# Patient Record
Sex: Female | Born: 1965 | Race: Black or African American | Hispanic: No | Marital: Single | Smoking: Current every day smoker
Health system: Southern US, Community
[De-identification: ages and names within clinical notes are randomized; demographics above are authoritative.]

## PROBLEM LIST (undated history)

## (undated) HISTORY — PX: APPENDECTOMY: SHX54

---

## 2003-07-19 ENCOUNTER — Ambulatory Visit (HOSPITAL_COMMUNITY): Admission: RE | Admit: 2003-07-19 | Discharge: 2003-07-19 | Payer: Self-pay | Admitting: Family Medicine

## 2007-01-12 ENCOUNTER — Emergency Department (HOSPITAL_COMMUNITY): Admission: EM | Admit: 2007-01-12 | Discharge: 2007-01-12 | Payer: Self-pay | Admitting: Family Medicine

## 2011-12-22 ENCOUNTER — Encounter (HOSPITAL_COMMUNITY): Payer: Self-pay | Admitting: *Deleted

## 2011-12-22 ENCOUNTER — Emergency Department (HOSPITAL_COMMUNITY)
Admission: EM | Admit: 2011-12-22 | Discharge: 2011-12-23 | Disposition: A | Discharge status: Home / Self Care | Payer: Self-pay | Attending: Emergency Medicine | Admitting: Emergency Medicine

## 2011-12-22 ENCOUNTER — Emergency Department (HOSPITAL_COMMUNITY): Payer: Self-pay

## 2011-12-22 DIAGNOSIS — M25559 Pain in unspecified hip: Secondary | ICD-10-CM | POA: Insufficient documentation

## 2011-12-22 DIAGNOSIS — W19XXXA Unspecified fall, initial encounter: Secondary | ICD-10-CM

## 2011-12-22 DIAGNOSIS — M25551 Pain in right hip: Secondary | ICD-10-CM

## 2011-12-22 DIAGNOSIS — F10929 Alcohol use, unspecified with intoxication, unspecified: Secondary | ICD-10-CM

## 2011-12-22 DIAGNOSIS — F1010 Alcohol abuse, uncomplicated: Secondary | ICD-10-CM | POA: Insufficient documentation

## 2011-12-22 DIAGNOSIS — W108XXA Fall (on) (from) other stairs and steps, initial encounter: Secondary | ICD-10-CM | POA: Insufficient documentation

## 2011-12-22 DIAGNOSIS — Y92009 Unspecified place in unspecified non-institutional (private) residence as the place of occurrence of the external cause: Secondary | ICD-10-CM | POA: Insufficient documentation

## 2011-12-22 NOTE — ED Notes (Addendum)
Pt presents to the ED with c/o of fall with right hip pain. Pt reports falling down the last two steps at her house. Pt states this happens a lot at her house because she continually slips on the carpeted steps. Pt states she landed on her right hip. Pt denies LOC, hitter her head, n/v. Pt is sitting at a 45 degree angle moving all around in her bed. Pt is very agitated and keeps c/o of being thirsty. Pt is very hard to console. Pt has no internal rotation with shortening. Pt is able to straighten leg on command with no report of increase pain. Pt reports increase in pain upon palpation

## 2011-12-22 NOTE — ED Notes (Signed)
Patient transported to X-ray 

## 2011-12-23 LAB — ETHANOL: Alcohol, Ethyl (B): 218 mg/dL — ABNORMAL HIGH (ref 0–11)

## 2011-12-23 MED ORDER — HYDROCODONE-ACETAMINOPHEN 5-325 MG PO TABS
1.0000 | ORAL_TABLET | Freq: Once | ORAL | Status: AC
  Administered 2011-12-23: 1 via ORAL
  Filled 2011-12-23: qty 1

## 2011-12-23 MED ORDER — SODIUM CHLORIDE 0.9 % IV BOLUS (SEPSIS)
500.0000 mL | Freq: Once | INTRAVENOUS | Status: AC
  Administered 2011-12-23: 500 mL via INTRAVENOUS

## 2011-12-23 MED ORDER — IBUPROFEN 600 MG PO TABS: 600.0000 mg | ORAL_TABLET | Freq: Three times a day (TID) | ORAL | Status: AC | PRN

## 2011-12-23 NOTE — ED Provider Notes (Signed)
History     CSN: [id number]  Arrival date & time 12/22/11  2313   First MD Initiated Contact with Patient 12/22/11 2330      Chief Complaint  Patient presents with  . Fall  . Hip Pain    right    (Consider location/radiation/quality/duration/timing/severity/associated sxs/prior treatment) The history is provided by the patient.   patient reports she was walking down the steps at her house this evening and she fell landing on her right hip.  She did not hit her head.  She denies neck pain.  She has no weakness in her upper or lower extremities.  She has no headache.  She denies loss of consciousness.  She's had no recent weakness of her arms or legs.  She's had no difficulty with her speech.  She does report drinking alcohol tonight.  Her pain is moderate but at this time she reports she does not want any medicine.  Her pain is worsened by ambulating and by movement of her right hip.  She denies back pain.  She denies weakness or numbness of her right lower extremity she denies numbness  History reviewed. No pertinent past medical history.  Past Surgical History  Procedure Date  . Appendectomy     History reviewed. No pertinent family history.  History  Substance Use Topics  . Smoking status: Current Everyday Smoker  . Smokeless tobacco: Never Used  . Alcohol Use: 48.0 oz/week    80 Cans of beer per week    OB History    Grav Para Term Preterm Abortions TAB SAB Ect Mult Living                  Review of Systems  All other systems reviewed and are negative.    Allergies  Review of patient's allergies indicates no known allergies.  Home Medications   Current Outpatient Rx  Name Route Sig Dispense Refill  . IBUPROFEN 200 MG PO TABS Oral Take 200 mg by mouth every 6 (six) hours as needed.    . IBUPROFEN 600 MG PO TABS Oral Take 1 tablet (600 mg total) by mouth every 8 (eight) hours as needed for pain. 15 tablet 0    BP 109/56  Pulse 92  Temp(Src) 98.7 F (37.1  C) (Oral)  Resp 22  Ht 5\' 7"  (1.702 m)  SpO2 100%  LMP 12/10/2011  Physical Exam  Nursing note and vitals reviewed. Constitutional: She is oriented to person, place, and time. She appears well-developed and well-nourished. No distress.       she smells of alcohol  HENT:  Head: Normocephalic and atraumatic.  Eyes: EOM are normal.  Neck: Normal range of motion.  Cardiovascular: Normal rate, regular rhythm and normal heart sounds.   Pulmonary/Chest: Effort normal and breath sounds normal.  Abdominal: Soft. She exhibits no distension. There is no tenderness.  Musculoskeletal:       Pain with range of motion of right hip.  No obvious deformity.  Normal pulses in her bilateral PT and DP pulses.  No ecchymosis lacerations or abrasions  Neurological: She is alert and oriented to person, place, and time.  Skin: Skin is warm and dry.  Psychiatric: She has a normal mood and affect. Judgment normal.    ED Course  Procedures (including critical care time)  Labs Reviewed  ETHANOL - Abnormal; Notable for the following:    Alcohol, Ethyl (B) 218 (*)    All other components within normal limits   Dg Hip  Complete Right  12/23/2011  *RADIOLOGY REPORT*  Clinical Data: Post fall, now with right-sided hip pain  RIGHT HIP - COMPLETE 2+ VIEW  Comparison: None.  Findings: No fracture dislocation.  Right hip joint spaces are preserved.  Limited visualization of the pelvis is normal. Regional soft tissues are normal.  IMPRESSION: No fracture or significant degenerative change.  Original Report Authenticated By: Waynard Reeds, M.D.     1. Fall   2. Right hip pain   3. Alcohol intoxication       MDM   I suspect this is contusion.  Her fall was mechanical.  Suspect this was exacerbated because of her alcohol use tonight.  She reports she continues to fall out of encouraged her to stop drinking so much alcohol as I think this is affecting her ability to ambulate  1:12 AM Alcohol level CCXVIII.   X-ray negative.  The patient has been ambulating on her leg.  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  DC home in good condition.      Lyanne Co, MD 12/23/11 [phone]

## 2011-12-23 NOTE — Discharge Instructions (Signed)
Alcohol Problems Most adults who drink alcohol drink in moderation (not a lot) are at low risk for developing problems related to their drinking. However, all drinkers, including low-risk drinkers, should know about the health risks connected with drinking alcohol. RECOMMENDATIONS FOR LOW-RISK DRINKING  Drink in moderation. Moderate drinking is defined as follows:   Men - no more than 2 drinks per day.   Nonpregnant women - no more than 1 drink per day.   Over age 45 - no more than 1 drink per day.  A standard drink is 12 grams of pure alcohol, which is equal to a 12 ounce bottle of beer or wine cooler, a 5 ounce glass of wine, or 1.5 ounces of distilled spirits (such as whiskey, brandy, vodka, or rum).  ABSTAIN FROM (DO NOT DRINK) ALCOHOL:  When pregnant or considering pregnancy.   When taking a medication that interacts with alcohol.   If you are alcohol dependent.   A medical condition that prohibits drinking alcohol (such as ulcer, liver disease, or heart disease).  DISCUSS WITH YOUR CAREGIVER:  If you are at risk for coronary heart disease, discuss the potential benefits and risks of alcohol use: Light to moderate drinking is associated with lower rates of coronary heart disease in certain populations (for example, men over age 45 and postmenopausal women). Infrequent or nondrinkers are advised not to begin light to moderate drinking to reduce the risk of coronary heart disease so as to avoid creating an alcohol-related problem. Similar protective effects can likely be gained through proper diet and exercise.   Women and the elderly have smaller amounts of body water than men. As a result women and the elderly achieve a higher blood alcohol concentration after drinking the same amount of alcohol.   Exposing a fetus to alcohol can cause a broad range of birth defects referred to as Fetal Alcohol Syndrome (FAS) or Alcohol-Related Birth Defects (ARBD). Although FAS/ARBD is connected with  excessive alcohol consumption during pregnancy, studies also have reported neurobehavioral problems in infants born to mothers reporting drinking an average of 1 drink per day during pregnancy.   Heavier drinking (the consumption of more than 4 drinks per occasion by men and more than 3 drinks per occasion by women) impairs learning (cognitive) and psychomotor functions and increases the risk of alcohol-related problems, including accidents and injuries.  CAGE QUESTIONS:   Have you ever felt that you should Cut down on your drinking?   Have people Annoyed you by criticizing your drinking?   Have you ever felt bad or Guilty about your drinking?   Have you ever had a drink first thing in the morning to steady your nerves or get rid of a hangover (Eye opener)?  If you answered positively to any of these questions: You may be at risk for alcohol-related problems if alcohol consumption is:   Men: Greater than 14 drinks per week or more than 4 drinks per occasion.   Women: Greater than 7 drinks per week or more than 3 drinks per occasion.  Do you or your family have a medical history of alcohol-related problems, such as:  Blackouts.   Sexual dysfunction.   Depression.   Trauma.   Liver dysfunction.   Sleep disorders.   Hypertension.   Chronic abdominal pain.   Has your drinking ever caused you problems, such as problems with your family, problems with your work (or school) performance, or accidents/injuries?   Do you have a compulsion to drink or a preoccupation  Do you have a compulsion to drink or a preoccupation with drinking?   Do you have poor control or are you unable to stop drinking once you have started?   Do you have to drink to avoid withdrawal symptoms?   Do you have problems with withdrawal such as tremors, nausea, sweats, or mood disturbances?   Does it take more alcohol than in the past to get you high?   Do you feel a strong urge to drink?   Do you change your plans so that you can have a drink?   Do you ever drink in the morning to relieve  the shakes or a hangover?  If you have answered a number of the previous questions positively, it may be time for you to talk to your caregivers, family, and friends and see if they think you have a problem. Alcoholism is a chemical dependency that keeps getting worse and will eventually destroy your health and relationships. Many alcoholics end up dead, impoverished, or in prison. This is often the end result of all chemical dependency.   Do not be discouraged if you are not ready to take action immediately.   Decisions to change behavior often involve up and down desires to change and feeling like you cannot decide.   Try to think more seriously about your drinking behavior.   Think of the reasons to quit.  WHERE TO GO FOR ADDITIONAL INFORMATION    The National Institute on Alcohol Abuse and Alcoholism (NIAAA)www.niaaa.nih.gov   National Council on Alcoholism and Drug Dependence (NCADD)www.ncadd.org   American Society of Addiction Medicine (ASAM)www.asam.org  Document Released: 08/11/2005 Document Revised: 07/31/2011 Document Reviewed: 03/29/2008  [employer] Patient Information 2012 [employer].

## 2013-06-16 ENCOUNTER — Other Ambulatory Visit (HOSPITAL_COMMUNITY): Payer: Self-pay | Admitting: *Deleted

## 2013-06-16 DIAGNOSIS — N63 Unspecified lump in unspecified breast: Secondary | ICD-10-CM

## 2013-06-16 DIAGNOSIS — N644 Mastodynia: Secondary | ICD-10-CM

## 2013-06-21 ENCOUNTER — Inpatient Hospital Stay (HOSPITAL_COMMUNITY): Admission: RE | Admit: 2013-06-21 | Payer: Self-pay | Source: Ambulatory Visit

## 2013-07-05 ENCOUNTER — Other Ambulatory Visit: Payer: Self-pay

## 2015-10-29 ENCOUNTER — Encounter (HOSPITAL_COMMUNITY): Payer: Self-pay | Admitting: Emergency Medicine

## 2015-10-29 ENCOUNTER — Emergency Department (HOSPITAL_COMMUNITY): Payer: Self-pay

## 2015-10-29 ENCOUNTER — Emergency Department (HOSPITAL_COMMUNITY)
Admission: EM | Admit: 2015-10-29 | Discharge: 2015-10-29 | Disposition: A | Discharge status: Home / Self Care | Payer: Self-pay | Attending: Emergency Medicine | Admitting: Emergency Medicine

## 2015-10-29 DIAGNOSIS — Y9354 Activity, bowling: Secondary | ICD-10-CM | POA: Insufficient documentation

## 2015-10-29 DIAGNOSIS — W1839XA Other fall on same level, initial encounter: Secondary | ICD-10-CM | POA: Insufficient documentation

## 2015-10-29 DIAGNOSIS — F17200 Nicotine dependence, unspecified, uncomplicated: Secondary | ICD-10-CM | POA: Insufficient documentation

## 2015-10-29 DIAGNOSIS — Y998 Other external cause status: Secondary | ICD-10-CM | POA: Insufficient documentation

## 2015-10-29 DIAGNOSIS — R200 Anesthesia of skin: Secondary | ICD-10-CM

## 2015-10-29 DIAGNOSIS — M545 Low back pain, unspecified: Secondary | ICD-10-CM

## 2015-10-29 DIAGNOSIS — S3992XA Unspecified injury of lower back, initial encounter: Secondary | ICD-10-CM | POA: Insufficient documentation

## 2015-10-29 DIAGNOSIS — Y9239 Other specified sports and athletic area as the place of occurrence of the external cause: Secondary | ICD-10-CM | POA: Insufficient documentation

## 2015-10-29 DIAGNOSIS — S7002XA Contusion of left hip, initial encounter: Secondary | ICD-10-CM

## 2015-10-29 DIAGNOSIS — R208 Other disturbances of skin sensation: Secondary | ICD-10-CM

## 2015-10-29 DIAGNOSIS — S8392XA Sprain of unspecified site of left knee, initial encounter: Secondary | ICD-10-CM

## 2015-10-29 LAB — URINALYSIS, ROUTINE W REFLEX MICROSCOPIC
Bilirubin Urine: NEGATIVE
Glucose, UA: NEGATIVE mg/dL
Ketones, ur: NEGATIVE mg/dL
Nitrite: NEGATIVE
Protein, ur: NEGATIVE mg/dL
Specific Gravity, Urine: 1.005 (ref 1.005–1.030)
pH: 6 (ref 5.0–8.0)

## 2015-10-29 LAB — URINE MICROSCOPIC-ADD ON

## 2015-10-29 MED ORDER — PREDNISONE 10 MG PO TABS: 20.0000 mg | ORAL_TABLET | Freq: Every day | ORAL | Status: DC

## 2015-10-29 MED ORDER — TRAMADOL HCL 50 MG PO TABS: 50.0000 mg | ORAL_TABLET | Freq: Four times a day (QID) | ORAL | Status: AC | PRN

## 2015-10-29 MED ORDER — KETOROLAC TROMETHAMINE 60 MG/2ML IM SOLN
60.0000 mg | Freq: Once | INTRAMUSCULAR | Status: AC
  Administered 2015-10-29: 60 mg via INTRAMUSCULAR
  Filled 2015-10-29: qty 2

## 2015-10-29 MED ORDER — ONDANSETRON 4 MG PO TBDP
4.0000 mg | ORAL_TABLET | Freq: Once | ORAL | Status: AC
  Administered 2015-10-29: 4 mg via ORAL
  Filled 2015-10-29: qty 1

## 2015-10-29 NOTE — ED Notes (Signed)
PA at the bedside.

## 2015-10-29 NOTE — Discharge Instructions (Signed)
Lumbosacral Radiculopathy °Lumbosacral radiculopathy is a condition that involves the spinal nerves and nerve roots in the low back and bottom of the spine. The condition develops when these nerves and nerve roots move out of place or become inflamed and cause symptoms. °CAUSES °This condition may be caused by: °· Pressure from a disk that bulges out of place (herniated disk). A disk is a plate of cartilage that separates bones in the spine. °· Disk degeneration. °· A narrowing of the bones of the lower back (spinal stenosis). °· A tumor. °· An infection. °· An injury that places sudden pressure on the disks that cushion the bones of your lower spine. °RISK FACTORS °This condition is more likely to develop in: °· Males aged 30-50 years. °· Females aged 50-60 years. °· People who lift improperly. °· People who are overweight or live a sedentary lifestyle. °· People who smoke. °· People who perform repetitive activities that strain the spine. °SYMPTOMS °Symptoms of this condition include: °· Pain that goes down from the back into the legs (sciatica). This is the most common symptom. The pain may be worse with sitting, coughing, or sneezing. °· Pain and numbness in the arms and legs. °· Muscle weakness. °· Tingling. °· Loss of bladder control or bowel control. °DIAGNOSIS °This condition is diagnosed with a physical exam and medical history. If the pain is lasting, you may have tests, such as: °· MRI scan. °· X-ray. °· CT scan. °· Myelogram. °· Nerve conduction study. °TREATMENT °This condition is often treated with: °· Hot packs and ice applied to affected areas. °· Stretches to improve flexibility. °· Exercises to strengthen back muscles. °· Physical therapy. °· Pain medicine. °· A steroid injection in the spine. °In some cases, no treatment is needed. If the condition is long-lasting (chronic), or if symptoms are severe, treatment may involve surgery or lifestyle changes, such as following a weight loss plan. °HOME  CARE INSTRUCTIONS °Medicines °· Take medicines only as directed by your health care provider. °· Do not drive or operate heavy machinery while taking pain medicine. °Injury Care °· Apply a heat pack to the injured area as directed by your health care provider. °· Apply ice to the affected area: °¨ Put ice in a plastic bag. °¨ Place a towel between your skin and the bag. °¨ Leave the ice on for 20-30 minutes, every 2 hours while you are awake or as needed. Or, leave the ice on for as long as directed by your health care provider. °Other Instructions °· If you were shown how to do any exercises or stretches, do them as directed by your health care provider. °· If your health care provider prescribed a diet or exercise program, follow it as directed. °· Keep all follow-up visits as directed by your health care provider. This is important. °SEEK MEDICAL CARE IF: °· Your pain does not improve over time even when taking pain medicines. °SEEK IMMEDIATE MEDICAL CARE IF: °· Your develop severe pain. °· Your pain suddenly gets worse. °· You develop increasing weakness in your legs. °· You lose the ability to control your bladder or bowel. °· You have difficulty walking or balancing. °· You have a fever. °  °This information is not intended to replace advice given to you by your health care provider. Make sure you discuss any questions you have with your health care provider. °  °Document Released: 08/11/2005 Document Revised: 12/26/2014 Document Reviewed: 08/07/2014 °Elsevier Interactive Patient Education ©2016 [employer] ° °

## 2015-10-29 NOTE — ED Provider Notes (Signed)
Patient signed out to me from [place]ost[place], [place].   She had a fall while she was bowling and presented with left hip and knee pain. Tracer unremarkable for any significant change.  However there is concern of urinary incontinence as well as weakness to the left leg it was unclear if this is due to pain or to weakness in the leg. The patient is agreeable to have MRI done and I will be following up on the results as well as urinalysis.  9:40 pm Patient has gone for MRI however results have not yet been read. Patient has not provided any urine. She would like to eat and is unwilling to wait therefore requests to sign out AGAINST MEDICAL ADVICE.  Risks of leaving without image results discussed. Pt understands the risks and understands that she wil lNOT be called with MRI results. Patient says she is hungry and wants to leave.  Strict return precautions discussed.   Marlon Peliffany Jerron Niblack, PA-C 10/29/15 2141   Patient decided she wanted to stay after leaving AMA. MRI results are back:     MR Lumbar Spine Wo Contrast (Final result) Result time: 10/29/15 21:46:01   Final result by Rad Results In Interface (10/29/15 21:46:01)   Narrative:   CLINICAL DATA: Initial evaluation for acute low back pain with left lower extremity numbness. Status post fall.  EXAM: MRI LUMBAR SPINE WITHOUT CONTRAST  TECHNIQUE: Multiplanar, multisequence MR imaging of the lumbar spine was performed. No intravenous contrast was administered.  COMPARISON: None.  FINDINGS: For the purposes of this dictation, the lowest well-formed intervertebral disc spaces presumed to be the L5-S1 level, and there presumed to be 5 lumbar type vertebral bodies.  Mild levoscoliosis of the lumbar spine with apex at L3 present. There is preservation of the normal lumbar lordosis. No listhesis. Vertebral body heights are well maintained. No fracture. Signal intensity within the vertebral body bone marrow is normal.  Minimal marrow edema within the bilateral pedicles at L4 and L5, likely stress reaction. No frank stress fracture identified. No other marrow edema.  Conus medullaris terminates normally at the L2 level. Signal intensity within the visualized cord is normal. Nerve roots of the cauda equina within normal limits.  Paraspinous soft tissues demonstrate no acute abnormality. Visualized visceral structures within normal limits. Small T2 hyperintense cyst noted within the inferior right hepatic lobe. Probable fibroid uterus. No retroperitoneal adenopathy.  L1-2: Negative.  L2-3: Very mild right-sided facet hypertrophy. Otherwise negative.  L3-4: Mild diffuse annular disc bulge without focal disc herniation. Right greater than left facet arthrosis with ligamentum flavum thickening. Very mild canal stenosis. No significant foraminal narrowing.  L4-5: Mild diffuse circumferential disc bulge. There is a shallow superimposed left foraminal disc protrusion, best appreciated on sagittal sequence (series 3, image 10). Moderate bilateral facet arthrosis with prominent joint effusions within the bilateral L4-5 facets. Mild reactive edema within the bilateral L4 pedicles again noted. Small 6 mm synovial cyst at the posterolateral aspect of the left L4-5 facet. Mild canal and left lateral recess stenosis. Moderate left foraminal stenosis related to disc protrusion and facet disease. No frank neural impingement. More mild right foraminal narrowing noted.  L5-S1: Central/left paracentral disc protrusion with slight caudad migration (series 6, image 34). Protruding disc encroaches upon the left lateral recess and abuts the transiting left S1 nerve root. There is mild left lateral recess stenosis without significant canal narrowing. The protruding disc and spends into the left neural foramen, contacting the exiting left L5 nerve root as well (  series 3, image 11). Resultant moderate left foraminal  stenosis. More mild right foraminal narrowing. Mild facet and ligamentum flavum hypertrophy with reactive effusions within the bilateral L5-S1 facets. Mild marrow edema again noted within the bilateral L5 pedicles.  IMPRESSION: 1. Shallow broad-based left paracentral disc protrusion at L5-S1 with extension into the left neural foramen. The protruding disc contacts the left S1 nerve root in the left lateral recess as well as the left L5 nerve root in the left neural foramen. This could potentially result in left lower extremity radicular symptoms. 2. Shallow left foraminal disc protrusion at L4-5, which superimposed on moderate facet disease results in moderate left L4-5 foraminal stenosis. No frank neural impingement. 3. Prominent bilateral facet disease at L4-5 and L5-S1 (worse at L4-5). There are is associated mild reactive marrow edema within the bilateral L4 and L5 pedicles. 4. Mild levoscoliosis with additional more mild degenerative changes at L2-3 and L3-4 as above. 5. Probable fibroid uterus.   Electronically Signed By: Rise Mu M.D. On: 10/29/2015 21:46     Reviewed MRI with Dr. Freida Busman and dicussed patient. He recommends prednisone and pain medication, she needs to follow-up with referred provider. This results and plan with patient and her mother. She states that she is nauseous and request nausea medicine prior to discharge. -- notably pt is up and walking in room.  Marlon Pel, PA-C 10/29/15 2220  Marlon Pel, PA-C 10/29/15 2220  Lorre Nick, MD 10/30/15 [phone]

## 2015-10-29 NOTE — ED Notes (Signed)
Pt reports fall while bowling at 1500 today resulting in left hip and knee pain.

## 2015-10-29 NOTE — ED Provider Notes (Signed)
CSN: [id number]     Arrival date & time 10/29/15  1718 History   First MD Initiated Contact with Patient 10/29/15 1946     Chief Complaint  Patient presents with  . Knee Pain  . Hip Pain     (Consider location/radiation/quality/duration/timing/severity/associated sxs/prior Treatment) HPI Audrey Cook is a 50 y.o. female presents to ED with complaint of left leg pain. Pt states she was bowling and states fell onto left leg. Presents with left hip and left knee pain. Pt reports some back and hip pain for several months. States "my toes sometimes go numb  Like they are now." she reports sudden urge to go to the bathroom and smells strongly of urine and reports she is not sure if she used a bathroom on her. She reports severe pain in the left hip with sitting, walking, laying down. She States she does not like taking pain medications so she has not taken anything for the pain. She reports numbness to the left leg. She reports difficulty moving her foot and toes.  History reviewed. No pertinent past medical history. Past Surgical History  Procedure Laterality Date  . Appendectomy     No family history on file. Social History  Substance Use Topics  . Smoking status: Current Every Day Smoker  . Smokeless tobacco: Never Used  . Alcohol Use: 48.0 oz/week    80 Cans of beer per week   OB History    No data available     Review of Systems  Constitutional: Negative for fever and chills.  Respiratory: Negative for cough, chest tightness and shortness of breath.   Cardiovascular: Negative for chest pain, palpitations and leg swelling.  Gastrointestinal: Negative for nausea, vomiting, abdominal pain and diarrhea.  Genitourinary: Negative for dysuria, flank pain and pelvic pain.  Musculoskeletal: Positive for myalgias, back pain, arthralgias and gait problem. Negative for neck pain and neck stiffness.  Skin: Negative for rash.  Neurological: Positive for weakness and numbness. Negative for  dizziness and headaches.  All other systems reviewed and are negative.     Allergies  Review of patient's allergies indicates no known allergies.  Home Medications   Prior to Admission medications   Medication Sig Start Date End Date Taking? Authorizing Provider  ibuprofen (ADVIL,MOTRIN) 200 MG tablet Take 200 mg by mouth every 6 (six) hours as needed.    Historical Provider, MD   BP 115/64 mmHg  Pulse 81  Temp(Src) 98.6 F (37 C) (Oral)  Resp 18  SpO2 100%  LMP 10/24/2015 Physical Exam  Constitutional: She is oriented to person, place, and time. She appears well-developed and well-nourished. No distress.  Smells strongly of urine  HENT:  Head: Normocephalic.  Eyes: Conjunctivae are normal.  Neck: Neck supple.  Cardiovascular: Normal rate, regular rhythm and normal heart sounds.   Pulmonary/Chest: Effort normal and breath sounds normal. No respiratory distress. She has no wheezes. She has no rales.  Abdominal: Soft. Bowel sounds are normal. She exhibits no distension. There is no tenderness. There is no rebound.  Musculoskeletal: She exhibits no edema.  Tenderness to palpation over midline lumbar spine, left SI joint, left hip. Tenderness extends down left thigh into the left knee diffusely. Patient has pain with range of motion of the hip and left knee. Patient is able to bear weight on the leg. Pain with left straight leg raise.  Neurological: She is alert and oriented to person, place, and time.  Patient is dragging left leg when walking. Decreased sensation  of the lateral left leg and toes. Decreased strength with dorsiflexion of the feet on left foot more than right, question whether this could be from pain. Unable to get reflexes, possibly due to patient's cooperation.  Skin: Skin is warm and dry.  Psychiatric: She has a normal mood and affect. Her behavior is normal.  Nursing note and vitals reviewed.   ED Course  Procedures (including critical care time) Labs  Review Labs Reviewed  URINALYSIS, ROUTINE W REFLEX MICROSCOPIC (NOT AT [facility])    Imaging Review Dg Knee Complete 4 Views Left  10/29/2015  CLINICAL DATA:  Hip and left knee pain following twisting injury bowling today. EXAM: LEFT KNEE - COMPLETE 4+ VIEW COMPARISON:  None. FINDINGS: The mineralization and alignment are normal. There is no evidence of acute fracture or dislocation. The joint spaces are maintained. No significant joint effusion or focal soft tissue abnormality identified. IMPRESSION: Negative left knee radiographs. Electronically Signed   By: Carey Bullocks M.D.   On: 10/29/2015 19:31   Dg Hip Unilat With Pelvis 2-3 Views Left  10/29/2015  CLINICAL DATA:  Hip and knee pain with limited range of motion and limited weight-bearing after twisting injury bowling today. EXAM: DG HIP (WITH OR WITHOUT PELVIS) 2-3V LEFT COMPARISON:  None. FINDINGS: The mineralization and alignment are normal. There is no evidence of acute fracture or dislocation. No evidence of femoral head avascular necrosis. The hip and sacroiliac joint spaces are maintained. Safety pins overlying the sacrum are presumably external to the patient. IMPRESSION: No acute osseous findings or significant arthropathic changes at the left hip. Electronically Signed   By: Carey Bullocks M.D.   On: 10/29/2015 19:29   I have personally reviewed and evaluated these images and lab results as part of my medical decision-making.   EKG Interpretation None      MDM   Final diagnoses:  Knee sprain, left, initial encounter  Contusion of left hip, initial encounter  Lumbar back pain  Numbness of left foot    Patient with left leg pain and lower back pain after fall today. Admits to pain over the last several months in the left hip radiating down left leg. Also admits to weakness and numbness to the left foot. Patient smells strongly of urine, she is unsure if she urinated on herself. X-rays of the hip and knee are unremarkable.  Concerned about patient dragging her left behind as she is walking and possibly having urinary incontinence. I will get MRI of her lumbar spine to rule out cauda equina. Patient does not have follow-up with primary care doctor.  Plan to dc home with outpatient follow up if no significant findings on MRI. Signed out to PA [employer] Vitals:   10/29/15 1811 10/29/15 2228  BP: 115/64 121/67  Pulse: 81 74  Temp: 98.6 F (37 C)   TempSrc: Oral   Resp: 18 16  SpO2: 100% 99%     Jaynie Crumble, PA-C 10/30/15 2314  Lorre Nick, MD 10/30/15 2349

## 2015-10-29 NOTE — ED Notes (Signed)
Patient transported to MRI 

## 2015-10-30 ENCOUNTER — Telehealth (HOSPITAL_COMMUNITY): Payer: Self-pay | Admitting: Emergency Medicine

## 2017-03-18 ENCOUNTER — Emergency Department (HOSPITAL_COMMUNITY)
Admission: EM | Admit: 2017-03-18 | Discharge: 2017-03-18 | Disposition: A | Discharge status: Home / Self Care | Payer: Self-pay | Attending: Emergency Medicine | Admitting: Emergency Medicine

## 2017-03-18 ENCOUNTER — Encounter: Payer: Self-pay | Admitting: Emergency Medicine

## 2017-03-18 ENCOUNTER — Emergency Department (HOSPITAL_COMMUNITY): Payer: Self-pay

## 2017-03-18 DIAGNOSIS — F17200 Nicotine dependence, unspecified, uncomplicated: Secondary | ICD-10-CM | POA: Insufficient documentation

## 2017-03-18 DIAGNOSIS — M79645 Pain in left finger(s): Secondary | ICD-10-CM | POA: Insufficient documentation

## 2017-03-18 MED ORDER — METHOCARBAMOL 500 MG PO TABS: 500.0000 mg | ORAL_TABLET | Freq: Two times a day (BID) | ORAL | 0 refills | Status: AC

## 2017-03-18 NOTE — ED Triage Notes (Signed)
Pt states that approx. 2 months ago she stopped being able to move her L thumb. Drives for a living. Neuro intact. Alert and oriented.

## 2017-03-18 NOTE — Discharge Instructions (Signed)
Follow-up with the referred orthopedic doctor for further evaluation.  You can take Tylenol or Ibuprofen as directed for pain.  Take the pain medication as directed.  Return the emergency Department for any redness/swelling of the finger that begins extend the hand, any numbness/weakness of the hand, any fever, any other worsening pain, any other worsening concerning symptoms.

## 2017-03-18 NOTE — ED Provider Notes (Signed)
[facility]-EMERGENCY DEPT Provider Note   CSN: [id number] Arrival date & time: 03/18/17  [id number]     History   Chief Complaint Chief Complaint  Patient presents with  . Hand Pain    HPI Audrey Cook is a 51 y.o. female who presents with 2 months of progressively worsening left thumb pain and limited mobility. Patient denies any preceding trauma or injury to the thumb. Patient states that symptoms have gradually worsened over 2 months. She has been taking Aleve with no improvement of symptoms. She has not been evaluated for these symptoms. She reports difficulty moving the thumb secondary to pain. She denies any warmth, swelling, erythema to the thumb. Patient denies any fever, numbness/weakness.  The history is provided by the patient.    No past medical history on file.  There are no active problems to display for this patient.   Past Surgical History:  Procedure Laterality Date  . APPENDECTOMY      OB History    No data available       Home Medications    Prior to Admission medications   Medication Sig Start Date End Date Taking? Authorizing Provider  ibuprofen (ADVIL,MOTRIN) 200 MG tablet Take 200 mg by mouth every 6 (six) hours as needed for headache, mild pain or moderate pain.     [provider]  predniSONE (DELTASONE) 10 MG tablet Take 2 tablets (20 mg total) by mouth daily. 10/29/15   Marlon PelGreene, Tiffany, PA-C  ranitidine (ZANTAC) 75 MG tablet Take 75 mg by mouth daily as needed for heartburn.    [provider]  traMADol (ULTRAM) 50 MG tablet Take 1 tablet (50 mg total) by mouth every 6 (six) hours as needed. 10/29/15   Marlon PelGreene, Tiffany, PA-C    Family History No family history on file.  Social History Social History  Substance Use Topics  . Smoking status: Current Every Day Smoker  . Smokeless tobacco: Never Used  . Alcohol use 48.0 oz/week    80 Cans of beer per week     Allergies   Patient has no known allergies.   Review of  Systems Review of Systems  Constitutional: Negative for fever.  Musculoskeletal:       Left thumb pain  Neurological: Negative for weakness and numbness.     Physical Exam Updated Vital Signs BP (!) 148/95 (BP Location: Right Arm)   Pulse 71   Temp 98.1 F (36.7 C) (Oral)   Resp 16   LMP 09/19/2015   SpO2 100%   Physical Exam  Constitutional: She is oriented to person, place, and time. She appears well-developed and well-nourished.  HENT:  Head: Normocephalic and atraumatic.  Eyes: Conjunctivae and EOM are normal. Right eye exhibits no discharge. Left eye exhibits no discharge. No scleral icterus.  Cardiovascular:  Pulses:      Radial pulses are 2+ on the right side, and 2+ on the left side.  Pulmonary/Chest: Effort normal.  Musculoskeletal:  Full range of motion of right wrist without difficulty. Full range of motion of left wrist without difficulty. Flexion and extension of left digits 2 through 5 intact. Patient is able to actively move the thumb at the metacarpal joints but she is unable to oppose the thumb and is unable to flex at the IP joint. No deformity or crepitus noted. No overlying edema, erythema or warmth.  Neurological: She is alert and oriented to person, place, and time.  Sensation intact to all major nerve distributions of the hand  Skin: Skin is warm and dry. Capillary refill takes less than 2 seconds.  Psychiatric: She has a normal mood and affect. Her speech is normal and behavior is normal.  Nursing note and vitals reviewed.    ED Treatments / Results  Labs (all labs ordered are listed, but only abnormal results are displayed) Labs Reviewed - No data to display  EKG  EKG Interpretation None       Radiology No results found.  Procedures Procedures (including critical care time)  Medications Ordered in ED Medications - No data to display   Initial Impression / Assessment and Plan / ED Course  I have reviewed the triage vital signs and  the nursing notes.  Pertinent labs & imaging results that were available during my care of the patient were reviewed by me and considered in my medical decision making (see chart for details).     51 year old female who presents with 2 months of left thumb pain and limited mobility. No preceding trauma or injury. Patient is afebrile, non-toxic appearing, sitting comfortably on examination table. Vital signs reviewed and stable. Patient is neurovascularly intact. Consider ligament sprain versus arthritis. History/physical exam are not concerning for septic arthritis, acute wrist fracture. Plan to check x-ray for evaluation.  X-ray reviewed. Negative for any acute fracture dislocation. They did note degenerative changes of the IP joint of the left thumb may be consistent with her arthritis. Discussed results with patient. Discussed conservative therapy measures with patient. We'll plan to place in splint for support and stabilization. Patient provided him referral for further workup. Strict return processes discussed. Patient expresses understanding and agreement to plan.    Final Clinical Impressions(s) / ED Diagnoses   Final diagnoses:  Pain of left thumb    New Prescriptions New Prescriptions   No medications on file     Rosana HoesLayden, Lourene Hoston A, PA-C 03/22/17 0258    Little, Ambrose Finlandachel Morgan, MD 03/24/17 [phone]

## 2017-03-24 ENCOUNTER — Ambulatory Visit (INDEPENDENT_AMBULATORY_CARE_PROVIDER_SITE_OTHER): Payer: Self-pay | Admitting: Family Medicine

## 2017-03-24 ENCOUNTER — Encounter: Payer: Self-pay | Admitting: Family Medicine

## 2017-03-24 VITALS — BP 125/79 | HR 90 | Temp 98.1°F | Resp 16 | Ht 67.0 in | Wt 115.8 lb

## 2017-03-24 DIAGNOSIS — M79645 Pain in left finger(s): Secondary | ICD-10-CM

## 2017-03-24 MED ORDER — PREDNISONE 20 MG PO TABS: ORAL_TABLET | ORAL | 0 refills | Status: DC

## 2017-03-24 NOTE — Progress Notes (Signed)
Patient ID: Audrey Cook, female    DOB: 05/14/1966, 51 y.o.   MRN: [id number]  PCP: Bing NeighborsHarris, Darionna Banke S, FNP  Chief Complaint  Patient presents with  . Establish Care  . Hand Pain    LEFT THUMB PAIN    Subjective:  HPI Audrey Cook is a 51 y.o. female presents for evaluation of left thumb pain ongoing for 2 months. Audrey Cook has previously received evaluation [facility] thumb [facility]/  Left hand x-ray was significant for: There is no acute bony abnormality of the left hand. 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 Audrey Cook was referred to orthopedic surgery for follow-up and treated with methocarbamol as needed for pain. Audrey Cook is uninsured and is unable to follow-up with ortho. Pain to left thumb is worsened with thumb flexion and extension. The thumb joint is warm to touch. Negative of erythema. The pain has not been relieved with ibuprofen and aleve. Pain is only improved with immobilization of the thumb.  Social History   Social History  . Marital status: Single    Spouse name: N/A  . Number of children: N/A  . Years of education: N/A   Occupational History  . Not on file.   Social History Main Topics  . Smoking status: Current Every Day Smoker  . Smokeless tobacco: Never Used  . Alcohol use 48.0 oz/week    80 Cans of beer per week  . Drug use: No  . Sexual activity: Not on file   Other Topics Concern  . Not on file   Social History Narrative  . No narrative on file   Review of Systems See HPI    Prior to Admission medications   Medication Sig Start Date End Date Taking? Authorizing Provider  ibuprofen (ADVIL,MOTRIN) 200 MG tablet Take 200 mg by mouth every 6 (six) hours as needed for headache, mild pain or moderate pain.    Yes [provider]  ranitidine (ZANTAC) 75 MG tablet Take 75 mg by mouth daily as needed for heartburn.   Yes [provider]  methocarbamol (ROBAXIN) 500  MG tablet Take 1 tablet (500 mg total) by mouth 2 (two) times daily. Patient not taking: Reported on 03/24/2017 03/18/17   Graciella FreerLayden, Lindsey A, PA-C  predniSONE (DELTASONE) 10 MG tablet Take 2 tablets (20 mg total) by mouth daily. Patient not taking: Reported on 03/24/2017 10/29/15   Marlon PelGreene, Tiffany, PA-C  traMADol (ULTRAM) 50 MG tablet Take 1 tablet (50 mg total) by mouth every 6 (six) hours as needed. Patient not taking: Reported on 03/24/2017 10/29/15   Marlon PelGreene, Tiffany, PA-C    Past Medical, Surgical Family and Social History reviewed and updated.    Objective:   Today's Vitals   03/24/17 1416  BP: 125/79  Pulse: 90  Resp: 16  Temp: 98.1 F (36.7 C)  TempSrc: Oral  SpO2: 100%  Weight: 115 lb 12.8 oz (52.5 kg)  Height: 5\' 7"  (1.702 m)    Wt Readings from Last 3 Encounters:  03/24/17 115 lb 12.8 oz (52.5 kg)   Physical Exam  Constitutional: She is oriented to person, place, and time. She appears well-developed and well-nourished.  HENT:  Head: Normocephalic and atraumatic.  Neck: Normal range of motion. Neck supple.  Cardiovascular: Normal rate, regular rhythm, normal heart sounds and intact distal pulses.   Pulmonary/Chest: Effort normal and breath sounds normal.  Musculoskeletal:       Left hand: Decreased strength noted.  She exhibits thumb/finger opposition.       Hands: Mild swelling of left thumb.  Neurological: She is alert and oriented to person, place, and time.  Skin: Skin is warm and dry.  Psychiatric: She has a normal mood and affect. Her behavior is normal. Judgment and thought content normal.    Assessment & Plan:  1. Thumb pain, left, ongoing x2 months, acute onset in the absence of injury.  Recent imaging of hand was significant for arthritic changes.  Differentials include:Gout, gouty arthritis, or joint inflammation from osteoarthritis. - Uric Acid -Sedimentation Rate -Will trial patient with a prednisone taper x 9 days. -Patient provided application for  [facility] [place] Patient Assistance Program.  RTC: Follow-up on left thumb pain 2 weeks.  Godfrey PickKimberly S. Tiburcio PeaHarris, MSN, FNP-C [facility]  [address] [place], [place] [address] [phone]

## 2017-03-24 NOTE — Patient Instructions (Addendum)
Take Prednisone 20 mg,  in mornings with breakfast as follows:  Take 3 pills for 3 days, Take 2 pills for 3 days, and Take 1 pill for 3 days.  Complete all medication.   This medication should not cause drowsiness, however do not take any ibuprofen or aleve with prescription. Once prednisone is complete you can resume ibuprofen or aleve as needed.   Gout Gout is painful swelling that can happen in some of your joints. Gout is a type of arthritis. This condition is caused by having too much uric acid in your body. Uric acid is a chemical that is made when your body breaks down substances called purines. If your body has too much uric acid, sharp crystals can form and build up in your joints. This causes pain and swelling. Gout attacks can happen quickly and be very painful (acute gout). Over time, the attacks can affect more joints and happen more often (chronic gout). Follow these instructions at home: During a Gout Attack  If directed, put ice on the painful area: ? Put ice in a plastic bag. ? Place a towel between your skin and the bag. ? Leave the ice on for 20 minutes, 2-3 times a day.  Rest the joint as much as possible. If the joint is in your leg, you may be given crutches to use.  Raise (elevate) the painful joint above the level of your heart as often as you can.  Drink enough fluids to keep your pee (urine) clear or pale yellow.  Take over-the-counter and prescription medicines only as told by your doctor.  Do not drive or use heavy machinery while taking prescription pain medicine.  Follow instructions from your doctor about what you can or cannot eat and drink.  Return to your normal activities as told by your doctor. Ask your doctor what activities are safe for you. Avoiding Future Gout Attacks  Follow a low-purine diet as told by a specialist (dietitian) or your doctor. Avoid foods and drinks that have a lot of purines, such  as: ? Liver. ? Kidney. ? Anchovies. ? Asparagus. ? Herring. ? Mushrooms ? Mussels. ? Beer.  Limit alcohol intake to no more than 1 drink a day for nonpregnant women and 2 drinks a day for men. One drink equals 12 oz of beer, 5 oz of wine, or 1 oz of hard liquor.  Stay at a healthy weight or lose weight if you are overweight. If you want to lose weight, talk with your doctor. It is important that you do not lose weight too fast.  Start or continue an exercise plan as told by your doctor.  Drink enough fluids to keep your pee clear or pale yellow.  Take over-the-counter and prescription medicines only as told by your doctor.  Keep all follow-up visits as told by your doctor. This is important. Contact a doctor if:  You have another gout attack.  You still have symptoms of a gout attack after10 days of treatment.  You have problems (side effects) because of your medicines.  You have chills or a fever.  You have burning pain when you pee (urinate).  You have pain in your lower back or belly. Get help right away if:  You have very bad pain.  Your pain cannot be controlled.  You cannot pee. This information is not intended to replace advice given to you by your health care provider. Make sure you discuss any questions you have with your health care provider.  Document Released: 05/20/2008 Document Revised: 01/17/2016 Document Reviewed: 05/24/2015 Elsevier Interactive Patient Education  [employer]2018 [employer] Inc.

## 2017-03-25 LAB — SEDIMENTATION RATE: Sed Rate: 7 mm/hr (ref 0–20)

## 2017-03-25 LAB — URIC ACID: Uric Acid, Serum: 3.8 mg/dL (ref 2.5–7.0)

## 2017-04-08 ENCOUNTER — Ambulatory Visit (INDEPENDENT_AMBULATORY_CARE_PROVIDER_SITE_OTHER): Payer: Self-pay | Admitting: Family Medicine

## 2017-04-08 ENCOUNTER — Encounter: Payer: Self-pay | Admitting: Family Medicine

## 2017-04-08 VITALS — BP 124/82 | HR 69 | Temp 98.0°F | Resp 16 | Ht 67.0 in | Wt 118.0 lb

## 2017-04-08 DIAGNOSIS — M79645 Pain in left finger(s): Secondary | ICD-10-CM

## 2017-04-08 MED ORDER — INDOMETHACIN 25 MG PO CAPS: 25.0000 mg | ORAL_CAPSULE | Freq: Two times a day (BID) | ORAL | 0 refills | Status: AC

## 2017-04-08 NOTE — Patient Instructions (Signed)
I am prescribing you Indomethacin 25 mg up to twice daily as needed for thumb pain.   Please notify me once you have completed [place] Application in order to receive a referral to hand specialist.    Gout Gout is painful swelling that can happen in some of your joints. Gout is a type of arthritis. This condition is caused by having too much uric acid in your body. Uric acid is a chemical that is made when your body breaks down substances called purines. If your body has too much uric acid, sharp crystals can form and build up in your joints. This causes pain and swelling. Gout attacks can happen quickly and be very painful (acute gout). Over time, the attacks can affect more joints and happen more often (chronic gout). Follow these instructions at home: During a Gout Attack  If directed, put ice on the painful area: ? Put ice in a plastic bag. ? Place a towel between your skin and the bag. ? Leave the ice on for 20 minutes, 2-3 times a day.  Rest the joint as much as possible. If the joint is in your leg, you may be given crutches to use.  Raise (elevate) the painful joint above the level of your heart as often as you can.  Drink enough fluids to keep your pee (urine) clear or pale yellow.  Take over-the-counter and prescription medicines only as told by your doctor.  Do not drive or use heavy machinery while taking prescription pain medicine.  Follow instructions from your doctor about what you can or cannot eat and drink.  Return to your normal activities as told by your doctor. Ask your doctor what activities are safe for you. Avoiding Future Gout Attacks  Follow a low-purine diet as told by a specialist (dietitian) or your doctor. Avoid foods and drinks that have a lot of purines, such as: ? Liver. ? Kidney. ? Anchovies. ? Asparagus. ? Herring. ? Mushrooms ? Mussels. ? Beer.  Limit alcohol intake to no more than 1 drink a day for nonpregnant women and 2 drinks a day  for men. One drink equals 12 oz of beer, 5 oz of wine, or 1 oz of hard liquor.  Stay at a healthy weight or lose weight if you are overweight. If you want to lose weight, talk with your doctor. It is important that you do not lose weight too fast.  Start or continue an exercise plan as told by your doctor.  Drink enough fluids to keep your pee clear or pale yellow.  Take over-the-counter and prescription medicines only as told by your doctor.  Keep all follow-up visits as told by your doctor. This is important. Contact a doctor if:  You have another gout attack.  You still have symptoms of a gout attack after10 days of treatment.  You have problems (side effects) because of your medicines.  You have chills or a fever.  You have burning pain when you pee (urinate).  You have pain in your lower back or belly. Get help right away if:  You have very bad pain.  Your pain cannot be controlled.  You cannot pee. This information is not intended to replace advice given to you by your health care provider. Make sure you discuss any questions you have with your health care provider. Document Released: 05/20/2008 Document Revised: 01/17/2016 Document Reviewed: 05/24/2015 Elsevier Interactive Patient Education  [employer]2018 [employer].

## 2017-04-08 NOTE — Progress Notes (Signed)
Patient ID: Audrey Cook, female    DOB: 03/12/1966, 51 y.o.   MRN: [id number]  PCP: Bing NeighborsHarris, Jamielynn Wigley S, FNP  Chief Complaint  Patient presents with  . Follow-up    2 Weeks on thumb pain    Subjective:  HPI Audrey RyderGloria F Cook is a 51 y.o. female presents for follow-up of left thumb pain. Audrey Cook has experienced persistent left thumb pain  for approximately 2.5 months. She was seen in office on 03/24/2017 for her thumb pain which was suspected to be related to a gout exacerbation. She was treated with a prednisone taper for 9 days. Uric acid level was normal. Today, Audrey Cook reports improvement of thumb pain. She currently rates pain as a 3/10 compared to 10/10 previously. Audrey Cook continues to experience some tenderness when unintentionally hitting her thumb, she experienced pronounced pain. She also reports that her thumb locks-up with flexion, and she has to manually manipulate her thumb back into position to achieve mobility. Prednisone resolved swelling and increased warmth.  Social History   Social History  . Marital status: Single    Spouse name: N/A  . Number of children: N/A  . Years of education: N/A   Occupational History  . Not on file.   Social History Main Topics  . Smoking status: Current Every Day Smoker  . Smokeless tobacco: Never Used  . Alcohol use 48.0 oz/week    80 Cans of beer per week  . Drug use: No  . Sexual activity: Not on file   Other Topics Concern  . Not on file   Social History Narrative  . No narrative on file    Family History  Problem Relation Age of Onset  . Family history unknown: Yes    Review of Systems See HPI   No Known Allergies  Prior to Admission medications   Medication Sig Start Date End Date Taking? Authorizing Provider  ibuprofen (ADVIL,MOTRIN) 200 MG tablet Take 200 mg by mouth every 6 (six) hours as needed for headache, mild pain or moderate pain.    Yes [provider]  methocarbamol (ROBAXIN) 500 MG tablet Take  1 tablet (500 mg total) by mouth 2 (two) times daily. 03/18/17  Yes Maxwell CaulLayden, Lindsey A, PA-C  ranitidine (ZANTAC) 75 MG tablet Take 75 mg by mouth daily as needed for heartburn.   Yes [provider]  traMADol (ULTRAM) 50 MG tablet Take 1 tablet (50 mg total) by mouth every 6 (six) hours as needed. Patient not taking: Reported on 03/24/2017 10/29/15   Marlon PelGreene, Tiffany, PA-C    Past Medical, Surgical Family and Social History reviewed and updated.    Objective:   Today's Vitals   04/08/17 1324  BP: 124/82  Pulse: 69  Resp: 16  Temp: 98 F (36.7 C)  TempSrc: Oral  SpO2: 100%  Weight: 118 lb (53.5 kg)  Height: 5\' 7"  (1.702 m)    Wt Readings from Last 3 Encounters:  04/08/17 118 lb (53.5 kg)  03/24/17 115 lb 12.8 oz (52.5 kg)   Physical Exam  Constitutional: She is oriented to person, place, and time.  Cardiovascular: Normal rate, regular rhythm, normal heart sounds and intact distal pulses.   Pulmonary/Chest: Effort normal and breath sounds normal.  Musculoskeletal: She exhibits tenderness. She exhibits no edema.       Left hand: She exhibits no thumb/finger opposition.       Hands: Neurological: She is alert and oriented to person, place, and time.  Skin: Skin is warm and  dry.  Psychiatric: She has a normal mood and affect. Her behavior is normal. Judgment and thought content normal.   Assessment & Plan:  1. Thumb pain, left, pain most likely indicates chronic gout. Patient given the [facility] Patient Assistance Application to complete for referral to a hand specialist to further evaluate abnormal mobility of thumb. -For treatment of persistent residual inflammation, will start patient on Indomethacin 25 mg twice daily with meals.  RTC: as needed and 6 months for routine wellness evaluation   Godfrey Pick. Tiburcio Pea, MSN, FNP-C [facility]  [address] Sherian Maroon [place], [place] [address] [phone]

## 2017-10-07 ENCOUNTER — Ambulatory Visit: Payer: Self-pay | Admitting: Family Medicine

## 2017-12-18 IMAGING — DX DG HAND COMPLETE 3+V*L*
3 series · 3 of 3 positions shown · non-contrast
Comparison: None in PACs

CLINICAL DATA: Left hand pain predominantly in the thumb centered
over the first CMC joint. No known injury.

EXAM:
LEFT HAND - COMPLETE 3+ VIEW

[hand ap]
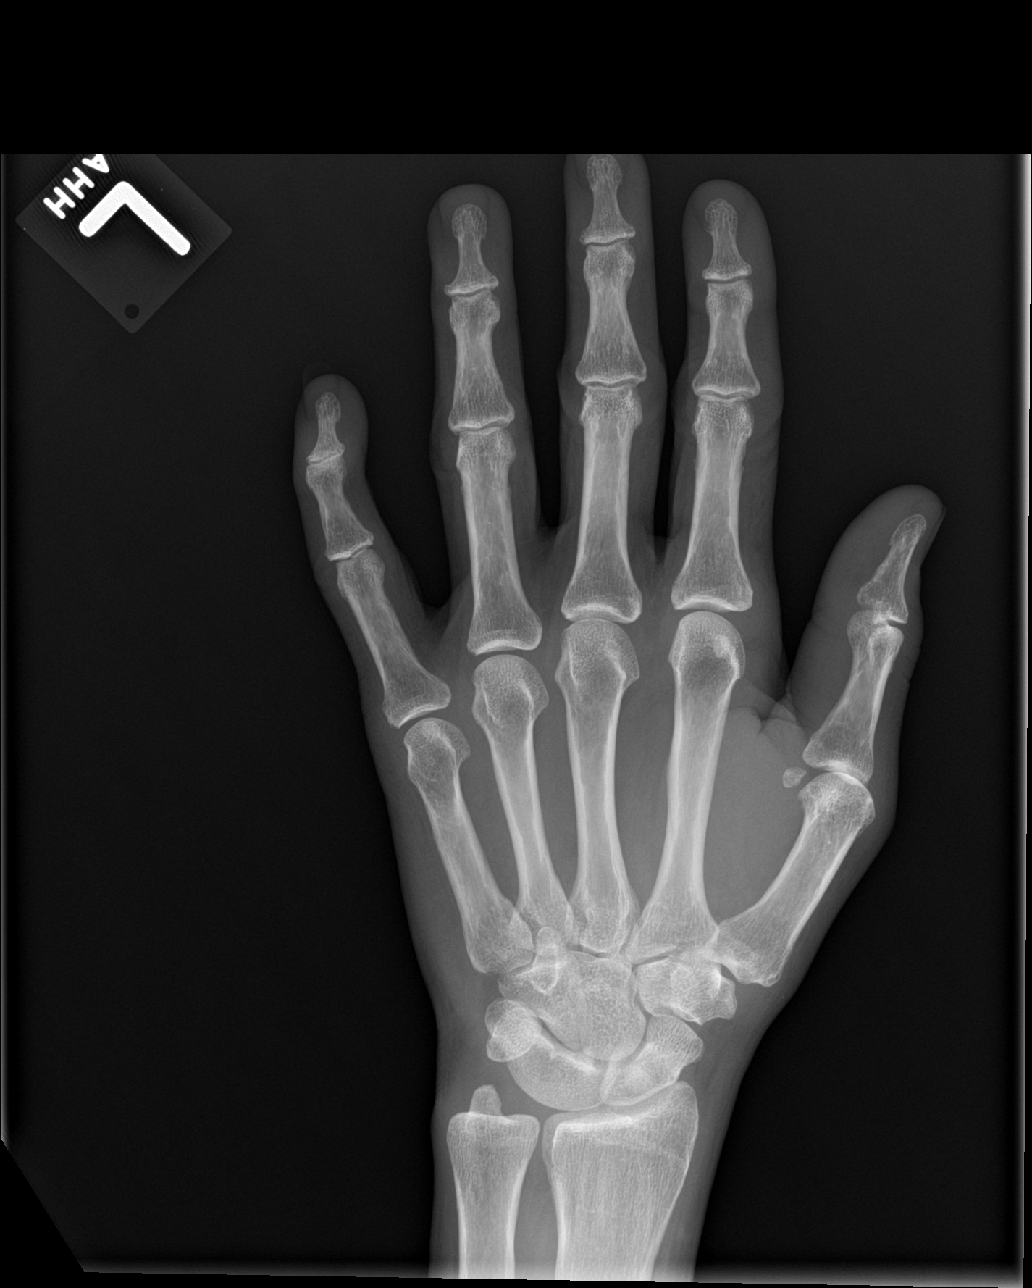

[hand obl]
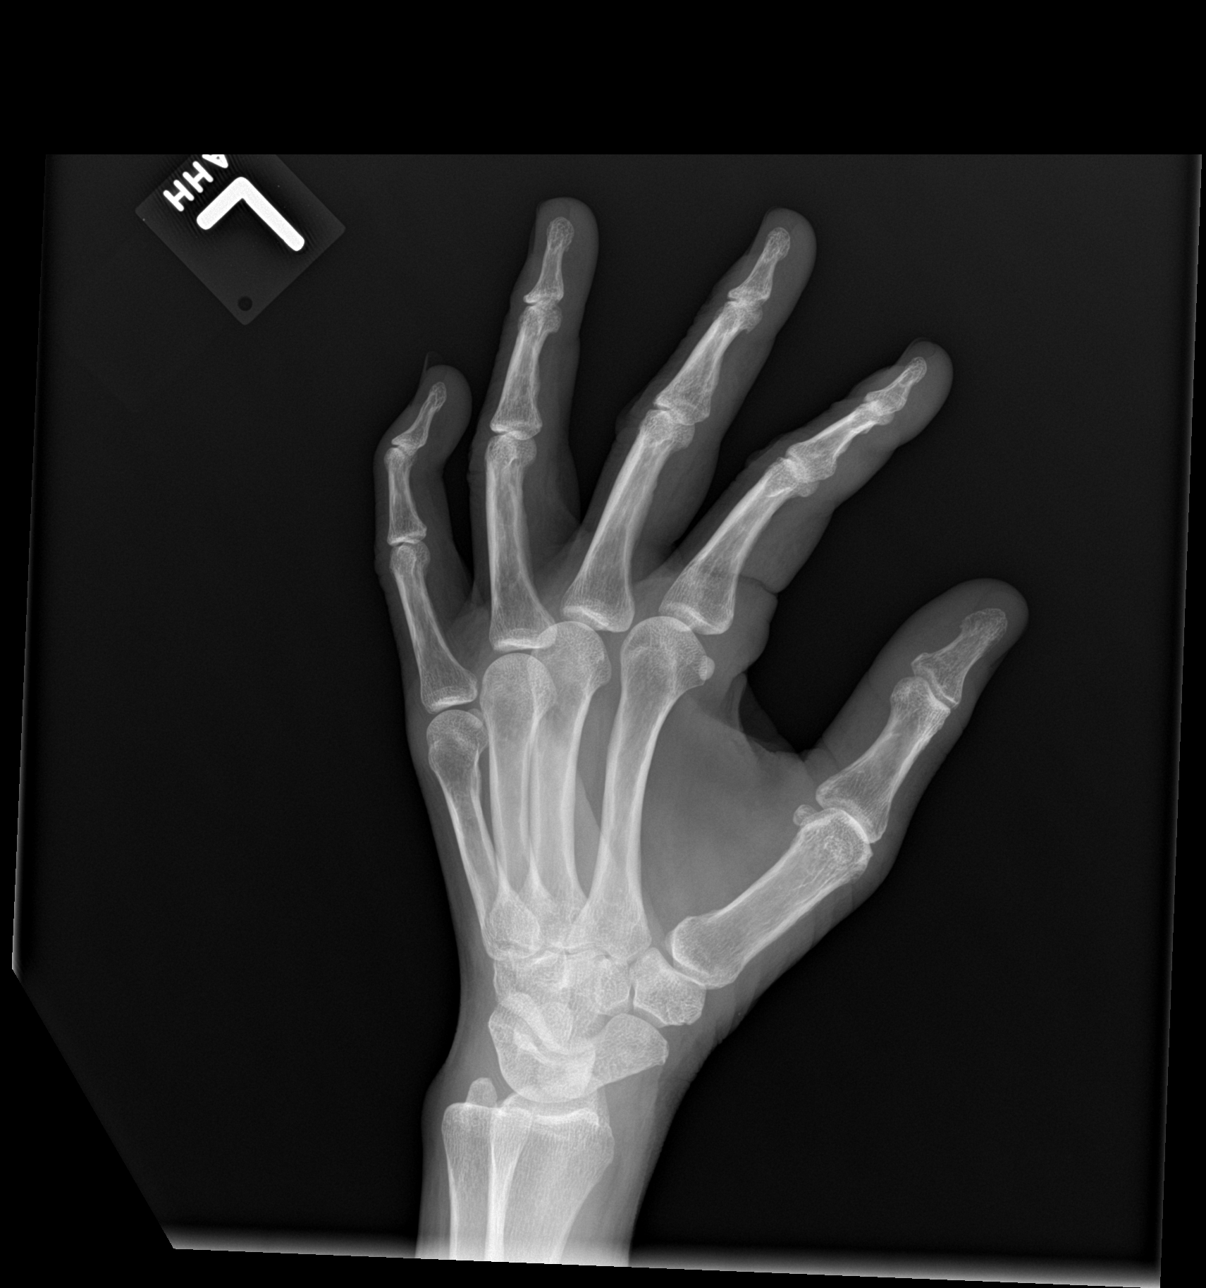

[hand lat]
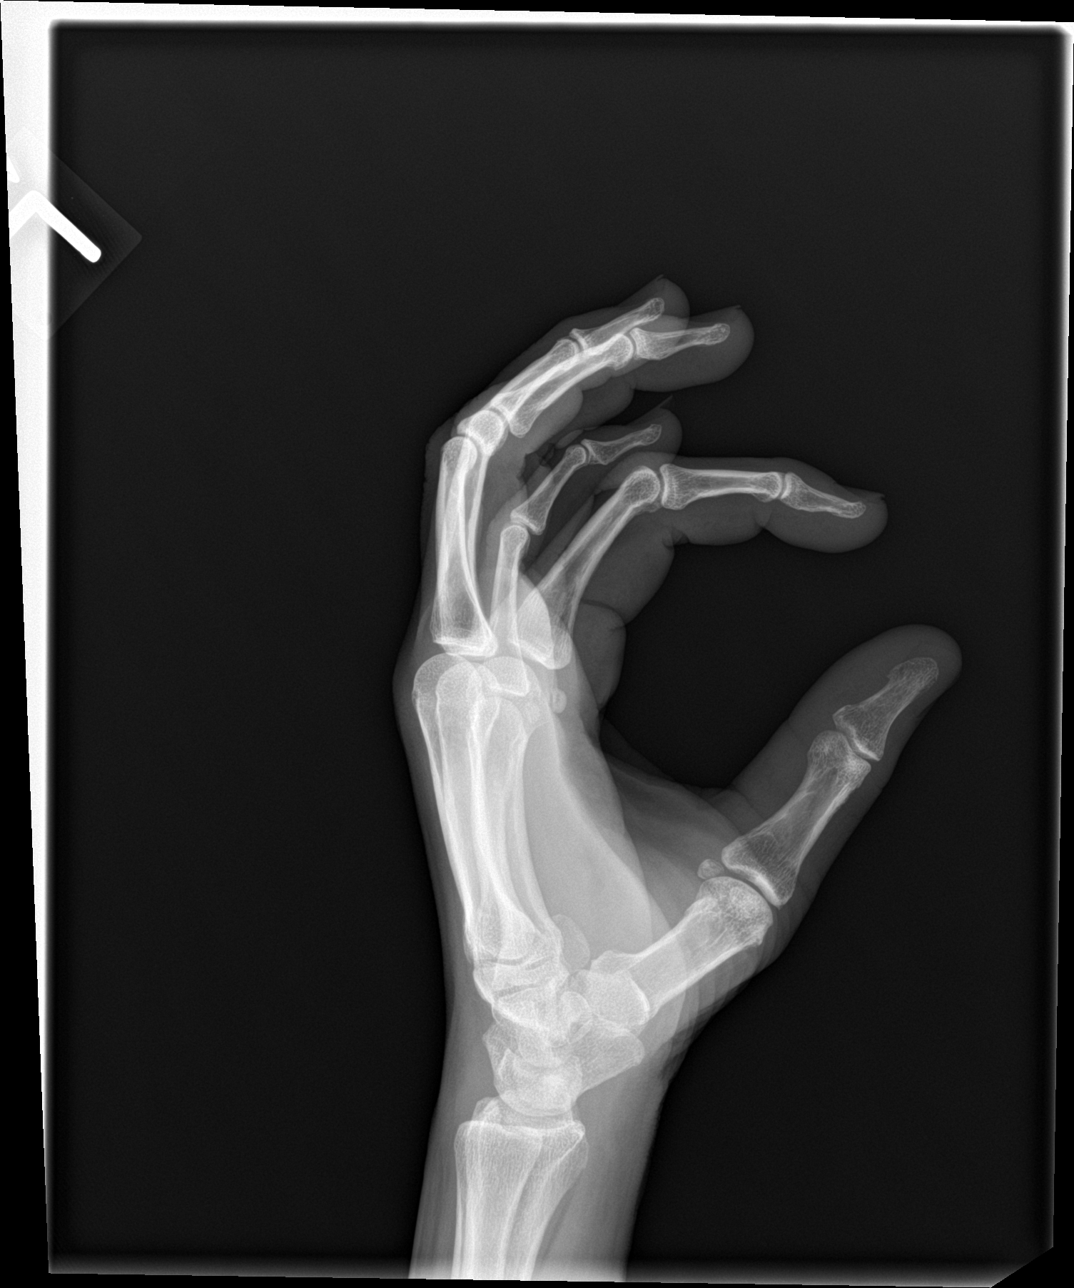

[3 of 3 positions shown; findings below may reference images not displayed]

FINDINGS: The bones of the hand are subjectively adequately mineralized. There
is no acute or healing fracture. There is no lytic or blastic
lesion. The interphalangeal and MCP joint spaces are
well-maintained. The second through fifth carpometacarpal joint
spaces are well maintained. Minimal degenerative change of the first
CMC joint is observed. There appears to be congenital fusion of the
lunate and triquetrum.
IMPRESSION: There is no acute bony abnormality of the left hand. Early
osteoarthritic change of the first CMC joint is suspected.
